# Patient Record
Sex: Female | Born: 1990 | Race: White | Hispanic: No | Marital: Single | Smoking: Current every day smoker
Health system: Southern US, Community
[De-identification: ages and names within clinical notes are randomized; demographics above are authoritative.]

## PROBLEM LIST (undated history)

## (undated) ENCOUNTER — Inpatient Hospital Stay (HOSPITAL_COMMUNITY): Payer: Self-pay

## (undated) DIAGNOSIS — R002 Palpitations: Secondary | ICD-10-CM

## (undated) DIAGNOSIS — Z789 Other specified health status: Secondary | ICD-10-CM

## (undated) DIAGNOSIS — T4145XA Adverse effect of unspecified anesthetic, initial encounter: Secondary | ICD-10-CM

## (undated) DIAGNOSIS — T8859XA Other complications of anesthesia, initial encounter: Secondary | ICD-10-CM

## (undated) DIAGNOSIS — R000 Tachycardia, unspecified: Secondary | ICD-10-CM

## (undated) DIAGNOSIS — I10 Essential (primary) hypertension: Secondary | ICD-10-CM

## (undated) DIAGNOSIS — M259 Joint disorder, unspecified: Secondary | ICD-10-CM

## (undated) DIAGNOSIS — R079 Chest pain, unspecified: Secondary | ICD-10-CM

## (undated) HISTORY — DX: Essential (primary) hypertension: I10

## (undated) HISTORY — DX: Tachycardia, unspecified: R00.0

## (undated) HISTORY — DX: Palpitations: R00.2

## (undated) HISTORY — DX: Chest pain, unspecified: R07.9

## (undated) HISTORY — PX: APPENDECTOMY: SHX54

---

## 2013-10-19 DIAGNOSIS — F339 Major depressive disorder, recurrent, unspecified: Secondary | ICD-10-CM

## 2017-01-21 ENCOUNTER — Inpatient Hospital Stay (HOSPITAL_COMMUNITY)
Admission: AD | Admit: 2017-01-21 | Discharge: 2017-01-22 | Disposition: A | Discharge status: Home / Self Care | Payer: Medicaid Other | Source: Ambulatory Visit | Attending: Obstetrics & Gynecology | Admitting: Obstetrics & Gynecology

## 2017-01-21 DIAGNOSIS — R109 Unspecified abdominal pain: Secondary | ICD-10-CM | POA: Insufficient documentation

## 2017-01-21 DIAGNOSIS — O99331 Smoking (tobacco) complicating pregnancy, first trimester: Secondary | ICD-10-CM | POA: Insufficient documentation

## 2017-01-21 DIAGNOSIS — O219 Vomiting of pregnancy, unspecified: Secondary | ICD-10-CM

## 2017-01-21 DIAGNOSIS — Z3A01 Less than 8 weeks gestation of pregnancy: Secondary | ICD-10-CM | POA: Insufficient documentation

## 2017-01-21 DIAGNOSIS — O26899 Other specified pregnancy related conditions, unspecified trimester: Secondary | ICD-10-CM

## 2017-01-21 DIAGNOSIS — F17210 Nicotine dependence, cigarettes, uncomplicated: Secondary | ICD-10-CM | POA: Insufficient documentation

## 2017-01-21 DIAGNOSIS — O210 Mild hyperemesis gravidarum: Secondary | ICD-10-CM | POA: Insufficient documentation

## 2017-01-21 DIAGNOSIS — O3680X0 Pregnancy with inconclusive fetal viability, not applicable or unspecified: Secondary | ICD-10-CM

## 2017-01-21 DIAGNOSIS — O26891 Other specified pregnancy related conditions, first trimester: Secondary | ICD-10-CM | POA: Insufficient documentation

## 2017-01-21 HISTORY — DX: Adverse effect of unspecified anesthetic, initial encounter: T41.45XA

## 2017-01-21 HISTORY — DX: Other complications of anesthesia, initial encounter: T88.59XA

## 2017-01-21 HISTORY — DX: Other specified health status: Z78.9

## 2017-01-22 ENCOUNTER — Inpatient Hospital Stay (HOSPITAL_COMMUNITY): Payer: Medicaid Other

## 2017-01-22 ENCOUNTER — Encounter: Payer: Self-pay | Admitting: Student

## 2017-01-22 DIAGNOSIS — O210 Mild hyperemesis gravidarum: Secondary | ICD-10-CM | POA: Diagnosis not present

## 2017-01-22 DIAGNOSIS — O219 Vomiting of pregnancy, unspecified: Secondary | ICD-10-CM

## 2017-01-22 DIAGNOSIS — F17210 Nicotine dependence, cigarettes, uncomplicated: Secondary | ICD-10-CM | POA: Diagnosis not present

## 2017-01-22 DIAGNOSIS — O26891 Other specified pregnancy related conditions, first trimester: Secondary | ICD-10-CM | POA: Diagnosis not present

## 2017-01-22 DIAGNOSIS — R109 Unspecified abdominal pain: Secondary | ICD-10-CM | POA: Diagnosis present

## 2017-01-22 DIAGNOSIS — Z3A01 Less than 8 weeks gestation of pregnancy: Secondary | ICD-10-CM | POA: Diagnosis not present

## 2017-01-22 DIAGNOSIS — O99331 Smoking (tobacco) complicating pregnancy, first trimester: Secondary | ICD-10-CM | POA: Diagnosis not present

## 2017-01-22 LAB — CBC
HCT: 44.3 % (ref 36.0–46.0)
Hemoglobin: 15.3 g/dL — ABNORMAL HIGH (ref 12.0–15.0)
MCH: 30.5 pg (ref 26.0–34.0)
MCHC: 34.5 g/dL (ref 30.0–36.0)
MCV: 88.4 fL (ref 78.0–100.0)
Platelets: 292 10*3/uL (ref 150–400)
RBC: 5.01 MIL/uL (ref 3.87–5.11)
RDW: 14.1 % (ref 11.5–15.5)
WBC: 17.3 10*3/uL — ABNORMAL HIGH (ref 4.0–10.5)

## 2017-01-22 LAB — WET PREP, GENITAL
CLUE CELLS WET PREP: NONE SEEN
Sperm: NONE SEEN
Trich, Wet Prep: NONE SEEN
Yeast Wet Prep HPF POC: NONE SEEN

## 2017-01-22 LAB — ABO/RH: ABO/RH(D): A POS

## 2017-01-22 LAB — URINALYSIS, ROUTINE W REFLEX MICROSCOPIC
BILIRUBIN URINE: NEGATIVE
GLUCOSE, UA: NEGATIVE mg/dL
HGB URINE DIPSTICK: NEGATIVE
Ketones, ur: NEGATIVE mg/dL
Nitrite: NEGATIVE
PROTEIN: NEGATIVE mg/dL
RBC / HPF: NONE SEEN RBC/hpf (ref 0–5)
Specific Gravity, Urine: 1.001 — ABNORMAL LOW (ref 1.005–1.030)
pH: 6 (ref 5.0–8.0)

## 2017-01-22 LAB — HCG, QUANTITATIVE, PREGNANCY: HCG, BETA CHAIN, QUANT, S: 3462 m[IU]/mL — AB (ref <5)

## 2017-01-22 LAB — POCT PREGNANCY, URINE: PREG TEST UR: POSITIVE — AB

## 2017-01-22 MED ORDER — PROMETHAZINE HCL 25 MG PO TABS: 25.0000 mg | ORAL_TABLET | Freq: Four times a day (QID) | ORAL | 0 refills | Status: DC | PRN

## 2017-01-22 MED ORDER — PROMETHAZINE HCL 25 MG PO TABS
25.0000 mg | ORAL_TABLET | Freq: Once | ORAL | Status: AC
  Administered 2017-01-22: 25 mg via ORAL
  Filled 2017-01-22: qty 1

## 2017-01-22 NOTE — Discharge Instructions (Signed)
Return to care  °· If you have heavier bleeding that soaks through more that 2 pads per hour for an hour or more °· If you bleed so much that you feel like you might pass out or you do pass out °· If you have significant abdominal pain that is not improved with Tylenol  °· If you develop a fever > 100.5 ° °

## 2017-01-22 NOTE — MAU Provider Note (Signed)
History     CSN: [id number]  Arrival date and time: 01/21/17 2353  First Provider Initiated Contact with Patient 01/22/17 0039      Chief Complaint  Patient presents with  . Abdominal Pain  . Nausea  . Emesis   HPI Yvonne Hart is a 26 y.o. G2P1011 at [redacted]w[redacted]d by unsure LMP who presents with abdominal pain & n/v. Reports intermittent lower abdominal pain x 3 days. When pain occurs rates 7/10. Has not treated. Nothing makes pain better or worse. Endorses nausea & vomiting. Vomited 3-4 times per day. Currently nauseated. Denies vaginal discharge, vaginal bleeding, dysuria, diarrhea, or constipation.   OB History    Gravida Para Term Preterm AB Living   2 1 1   1 1    SAB TAB Ectopic Multiple Live Births   1     0 1      Past Medical History:  Diagnosis Date  . Complication of anesthesia    alergic to the ansthetic when she had her Appendectomy  . Medical history non-contributory     Past Surgical History:  Procedure Laterality Date  . APPENDECTOMY    . CESAREAN SECTION      No family history on file.  Social History  Substance Use Topics  . Smoking status: Current Every Day Smoker    Packs/day: 0.50    Types: Cigarettes  . Smokeless tobacco: Never Used  . Alcohol use No    Allergies: Allergies not on file  No prescriptions prior to admission.    Review of Systems  Constitutional: Negative.   Gastrointestinal: Positive for abdominal pain, nausea and vomiting. Negative for constipation and diarrhea.  Genitourinary: Negative.    Physical Exam   Blood pressure 139/72, pulse (!) 105, temperature 98.1 F (36.7 C), temperature source Oral, resp. rate 18, height 5\' 7"  (1.702 m), weight 178 lb (80.7 kg), last menstrual period 12/18/2016, unknown if currently breastfeeding.  Physical Exam  Nursing note and vitals reviewed. Constitutional: She is oriented to person, place, and time. She appears well-developed and well-nourished. No distress.  HENT:  Head:  Normocephalic and atraumatic.  Eyes: Conjunctivae are normal. Right eye exhibits no discharge. Left eye exhibits no discharge. No scleral icterus.  Neck: Normal range of motion.  Cardiovascular: Normal rate, regular rhythm and normal heart sounds.   No murmur heard. Respiratory: Effort normal and breath sounds normal. No respiratory distress. She has no wheezes.  GI: Soft. Bowel sounds are normal. She exhibits no distension. There is no tenderness. There is no rebound and no guarding.  Neurological: She is alert and oriented to person, place, and time.  Skin: Skin is warm and dry. She is not diaphoretic.  Psychiatric: She has a normal mood and affect. Her behavior is normal. Judgment and thought content normal.    MAU Course  Procedures Results for orders placed or performed during the hospital encounter of 01/21/17 (from the past 24 hour(s))  Urinalysis, Routine w reflex microscopic (not at [facility])     Status: Abnormal   Collection Time: 01/22/17 12:05 AM  Result Value Ref Range   Color, Urine YELLOW YELLOW   APPearance HAZY (A) CLEAR   Specific Gravity, Urine 1.001 (L) 1.005 - 1.030   pH 6.0 5.0 - 8.0   Glucose, UA NEGATIVE NEGATIVE mg/dL   Hgb urine dipstick NEGATIVE NEGATIVE   Bilirubin Urine NEGATIVE NEGATIVE   Ketones, ur NEGATIVE NEGATIVE mg/dL   Protein, ur NEGATIVE NEGATIVE mg/dL   Nitrite NEGATIVE NEGATIVE  Leukocytes, UA MODERATE (A) NEGATIVE   RBC / HPF NONE SEEN 0 - 5 RBC/hpf   WBC, UA 0-5 0 - 5 WBC/hpf   Bacteria, UA RARE (A) NONE SEEN   Squamous Epithelial / LPF 0-5 (A) NONE SEEN  Pregnancy, urine POC     Status: Abnormal   Collection Time: 01/22/17 12:09 AM  Result Value Ref Range   Preg Test, Ur POSITIVE (A) NEGATIVE  Wet prep, genital     Status: Abnormal   Collection Time: 01/22/17 12:35 AM  Result Value Ref Range   Yeast Wet Prep HPF POC NONE SEEN NONE SEEN   Trich, Wet Prep NONE SEEN NONE SEEN   Clue Cells Wet Prep HPF POC NONE SEEN NONE SEEN   WBC, Wet  Prep HPF POC MODERATE (A) NONE SEEN   Sperm NONE SEEN   CBC     Status: Abnormal   Collection Time: 01/22/17 12:48 AM  Result Value Ref Range   WBC 17.3 (H) 4.0 - 10.5 K/uL   RBC 5.01 3.87 - 5.11 MIL/uL   Hemoglobin 15.3 (H) 12.0 - 15.0 g/dL   HCT 16.1 09.6 - 04.5 %   MCV 88.4 78.0 - 100.0 fL   MCH 30.5 26.0 - 34.0 pg   MCHC 34.5 30.0 - 36.0 g/dL   RDW 40.9 81.1 - 91.4 %   Platelets 292 150 - 400 K/uL  ABO/Rh     Status: None (Preliminary result)   Collection Time: 01/22/17 12:48 AM  Result Value Ref Range   ABO/RH(D) A POS   hCG, quantitative, pregnancy     Status: Abnormal   Collection Time: 01/22/17 12:48 AM  Result Value Ref Range   hCG, Beta Chain, Quant, S 3,462 (H) <5 mIU/mL   US Ob Transvaginal  Result Date: 01/22/2017 CLINICAL DATA:  Abdominal pain EXAM: OBSTETRIC <14 WK [place] AND TRANSVAGINAL OB US TECHNIQUE: Both transabdominal and transvaginal ultrasound examinations were performed for complete evaluation of the gestation as well as the maternal uterus, adnexal regions, and pelvic cul-de-sac. Transvaginal technique was performed to assess early pregnancy. COMPARISON:  None. FINDINGS: Intrauterine gestational sac: Probable intrauterine gestational sac Yolk sac:  Not seen Embryo:  Not seen MSD: 4.9  mm   5 w   1  d Subchorionic hemorrhage:  None visualized. Maternal uterus/adnexae: Small fluid in the upper and lower endometrium. Probable corpus luteal cyst in the right ovary measuring 2.2 cm. Right ovary measures 4.1 x 2.4 by 3.1 cm. Left ovary measures 1.9 by 2.2 x 1.7 cm. No significant free fluid. IMPRESSION: 1. Probable early intrauterine gestational sac, but no yolk sac, fetal pole, or cardiac activity yet visualized. Recommend follow-up quantitative B-HCG levels and follow-up US in 14 days to assess viability. This recommendation follows SRU consensus guidelines: Diagnostic Criteria for Nonviable Pregnancy Early in the First Trimester. Malva Limes Med 2013; 782:9562-13. 2. Small  amount of fluid within the upper and lower endometrial canal Electronically Signed   By: Jasmine Pang M.D.   On: 01/22/2017 01:42    MDM +UPT UA, wet prep, GC/chlamydia, CBC, ABO/Rh, quant hCG, HIV, and [place] today to rule out ectopic pregnancy A positive Phenergan 25 mg PO Ultrasound shows possible IUGS, no yolk sac This abdominal pain could represent a normal pregnancy, spontaneous abortion, or even an ectopic pregnancy which can be life-threatening. Cultures were obtained to rule out pelvic infection.  Assessment and Plan  A: 1. Pregnancy of unknown anatomic location   2. Abdominal pain during pregnancy  3. Nausea and vomiting during pregnancy prior to [redacted] weeks gestation    P: Discharge home Rx phenergan Discussed reasons to return to MAU including s/s ectopic pregnancy Patient to f/u in [facility] Monday morning for repeat bhcg GC/CT pending   Judeth Hornrin Kartik Fernando 01/22/2017, 12:39 AM

## 2017-01-22 NOTE — MAU Note (Signed)
Pt reports she has had a positive HPT(x6). Started having n/v x3 days and sharp lower abd x 2 days Pain is more on her left side.. Denies any vaginal discharge or bleeding. Unsure of her LMP. Had some spotting around 12/18/2016 but it was never like a regular period.

## 2017-01-24 ENCOUNTER — Ambulatory Visit: Payer: Self-pay | Admitting: General Practice

## 2017-01-24 ENCOUNTER — Telehealth: Payer: Self-pay | Admitting: General Practice

## 2017-01-24 DIAGNOSIS — O3680X0 Pregnancy with inconclusive fetal viability, not applicable or unspecified: Secondary | ICD-10-CM

## 2017-01-24 LAB — HCG, QUANTITATIVE, PREGNANCY: hCG, Beta Chain, Quant, S: 8608 m[IU]/mL — ABNORMAL HIGH (ref <5)

## 2017-01-24 LAB — GC/CHLAMYDIA PROBE AMP (~~LOC~~) NOT AT ARMC
Chlamydia: NEGATIVE
NEISSERIA GONORRHEA: NEGATIVE

## 2017-01-24 NOTE — Telephone Encounter (Signed)
Called patient and informed her of ultrasound appt on 7/24 with brief office visit to follow for results. Patient verbalized understanding & had no questions

## 2017-01-24 NOTE — Progress Notes (Signed)
Patient here today for a stat bhcg. Patient reports continued abdominal cramping & nausea. Instructed patient to wait in lobby for results & updated plan of care. Patient verbalizes understanding. Per Dr Alysia PennaErvin, patient has had sufficient increase in bhcg levels. Needs ultrasound in a week. Informed patient of results & recommended ultrasound. Told patient I will call her this afternoon with an ultrasound appt. Patient verbalized understanding & states a detailed message can be left on voicemail.

## 2017-02-01 ENCOUNTER — Ambulatory Visit (HOSPITAL_COMMUNITY)
Admission: RE | Admit: 2017-02-01 | Discharge: 2017-02-01 | Disposition: A | Discharge status: Home / Self Care | Payer: Medicaid Other | Source: Ambulatory Visit | Attending: Obstetrics and Gynecology | Admitting: Obstetrics and Gynecology

## 2017-02-01 ENCOUNTER — Encounter: Payer: Self-pay | Admitting: Family Medicine

## 2017-02-01 ENCOUNTER — Ambulatory Visit (INDEPENDENT_AMBULATORY_CARE_PROVIDER_SITE_OTHER): Payer: Self-pay

## 2017-02-01 DIAGNOSIS — Z369 Encounter for antenatal screening, unspecified: Secondary | ICD-10-CM

## 2017-02-01 DIAGNOSIS — Z3491 Encounter for supervision of normal pregnancy, unspecified, first trimester: Secondary | ICD-10-CM | POA: Diagnosis not present

## 2017-02-01 DIAGNOSIS — O3680X0 Pregnancy with inconclusive fetal viability, not applicable or unspecified: Secondary | ICD-10-CM

## 2017-02-01 DIAGNOSIS — Z3A01 Less than 8 weeks gestation of pregnancy: Secondary | ICD-10-CM | POA: Diagnosis not present

## 2017-02-01 DIAGNOSIS — N898 Other specified noninflammatory disorders of vagina: Secondary | ICD-10-CM

## 2017-02-01 DIAGNOSIS — Z3490 Encounter for supervision of normal pregnancy, unspecified, unspecified trimester: Secondary | ICD-10-CM | POA: Diagnosis present

## 2017-02-01 DIAGNOSIS — Z113 Encounter for screening for infections with a predominantly sexual mode of transmission: Secondary | ICD-10-CM

## 2017-02-01 NOTE — Progress Notes (Signed)
Patient came in for US results. I explained to patient that her US shows that she is 6 w 1d pregnant with an EDD of 09/26/17. I advised patient to start taking prenatal vitamins and to start her prenatal care.Patient also complained of having green itchy discharge. I had the patient self swab and verified her pharmacy. I explained to patient that I will call her with results as soon as they come in. Patient had no questions and verbalized understanding.

## 2017-02-01 NOTE — Addendum Note (Signed)
Addended by: Mikey BussingWILSON, Mirtha Jain L on: 02/01/2017 02:13 PM   Modules accepted: Orders

## 2017-02-02 LAB — CERVICOVAGINAL ANCILLARY ONLY
Bacterial vaginitis: NEGATIVE
Candida vaginitis: NEGATIVE
Chlamydia: NEGATIVE
NEISSERIA GONORRHEA: NEGATIVE
TRICH (WINDOWPATH): POSITIVE — AB

## 2017-02-03 ENCOUNTER — Other Ambulatory Visit: Payer: Self-pay | Admitting: Family Medicine

## 2017-02-03 ENCOUNTER — Telehealth: Payer: Self-pay

## 2017-02-03 DIAGNOSIS — A599 Trichomoniasis, unspecified: Secondary | ICD-10-CM

## 2017-02-03 MED ORDER — METRONIDAZOLE 500 MG PO TABS: 500.0000 mg | ORAL_TABLET | Freq: Two times a day (BID) | ORAL | 0 refills | Status: DC

## 2017-02-03 NOTE — Telephone Encounter (Signed)
Opened in error

## 2017-02-03 NOTE — Telephone Encounter (Signed)
Called patient to inform her that her wet prep came back positive for Trichomonas. Advised patient to have her partner treated as well. Medication was called in to pharmacy. Patient verbalized understanding.

## 2017-05-23 DIAGNOSIS — O34219 Maternal care for unspecified type scar from previous cesarean delivery: Secondary | ICD-10-CM | POA: Insufficient documentation

## 2017-12-17 ENCOUNTER — Encounter (HOSPITAL_COMMUNITY): Payer: Self-pay

## 2018-02-10 ENCOUNTER — Other Ambulatory Visit: Payer: Self-pay

## 2018-02-10 ENCOUNTER — Encounter (HOSPITAL_COMMUNITY): Payer: Self-pay

## 2018-02-10 ENCOUNTER — Encounter (HOSPITAL_COMMUNITY)
Admission: RE | Admit: 2018-02-10 | Discharge: 2018-02-10 | Disposition: A | Discharge status: Home / Self Care | Payer: Medicaid Other | Source: Ambulatory Visit | Attending: Orthopedic Surgery | Admitting: Orthopedic Surgery

## 2018-02-10 DIAGNOSIS — Z01812 Encounter for preprocedural laboratory examination: Secondary | ICD-10-CM | POA: Insufficient documentation

## 2018-02-10 HISTORY — DX: Joint disorder, unspecified: M25.9

## 2018-02-10 LAB — CBC
HCT: 46.7 % — ABNORMAL HIGH (ref 36.0–46.0)
Hemoglobin: 14.9 g/dL (ref 12.0–15.0)
MCH: 29.2 pg (ref 26.0–34.0)
MCHC: 31.9 g/dL (ref 30.0–36.0)
MCV: 91.6 fL (ref 78.0–100.0)
Platelets: 365 10*3/uL (ref 150–400)
RBC: 5.1 MIL/uL (ref 3.87–5.11)
RDW: 14.1 % (ref 11.5–15.5)
WBC: 10 10*3/uL (ref 4.0–10.5)

## 2018-02-10 LAB — BASIC METABOLIC PANEL
ANION GAP: 6 (ref 5–15)
BUN: 7 mg/dL (ref 6–20)
CALCIUM: 9.2 mg/dL (ref 8.9–10.3)
CO2: 25 mmol/L (ref 22–32)
CREATININE: 0.94 mg/dL (ref 0.44–1.00)
Chloride: 109 mmol/L (ref 98–111)
GFR calc non Af Amer: >60 mL/min (ref >60)
Glucose, Bld: 85 mg/dL (ref 70–99)
Potassium: 3.9 mmol/L (ref 3.5–5.1)
Sodium: 140 mmol/L (ref 135–145)

## 2018-02-10 LAB — SURGICAL PCR SCREEN
MRSA, PCR: NEGATIVE
Staphylococcus aureus: NEGATIVE

## 2018-02-10 NOTE — Progress Notes (Signed)
PCP - [facility]  Cardiologist - Denies  Chest x-ray - Denies  EKG - Denies  Stress Test - Denies  ECHO - Denies  Cardiac Cath - Denies  Sleep Study - Denies CPAP - None  LABS- 02/10/18: CBC, BMP 02/15/18: POC Upreg  ASA- Denies  Pt advised to check with surgeon office regarding her recent Bilateral nipple piercing, as she is not able to remove for surgery.  Anesthesia- No  Pt denies having chest pain, sob, or fever at this time. All instructions explained to the pt, with a verbal understanding of the material. Pt agrees to go over the instructions while at home for a better understanding. The opportunity to ask questions was provided.

## 2018-02-10 NOTE — Pre-Procedure Instructions (Signed)
Yvonne Hart  02/10/2018      [facility] [place] - [id number] [place] [id number] Phone: [phone] Fax: [phone]    Your procedure is scheduled on Wed., Aug. 7, 2019 from 3:30PM-5:21PM  [facility] "A" at 1:30PM  Call this number if you have problems the morning of surgery:  [phone]   Remember:  Do not eat or drink after midnight on Aug. 6th    Take these medicines the morning of surgery with A SIP OF WATER: NONE  As of today, stop taking all Other Aspirin Products, Vitamins, Fish oils, and Herbal medications. Also stop all NSAIDS i.e. Advil, Ibuprofen, Motrin, Aleve, Anaprox, Naproxen, BC, Goody Powders, and all Supplements.    Do not wear jewelry, make-up or nail polish.  Do not wear lotions, powders, or perfumes, or deodorant.  Do not shave 48 hours prior to surgery.    Do not bring valuables to the hospital.  [facility] is not responsible for any belongings or valuables.  Contacts, dentures or bridgework may not be worn into surgery.  Leave your suitcase in the car.  After surgery it may be brought to your room.  For patients admitted to the hospital, discharge time will be determined by your treatment team.  Patients discharged the day of surgery will not be allowed to drive home.   Special instructions:   [place]- Preparing For Surgery  Before surgery, you can play an important role. Because skin is not sterile, your skin needs to be as free of germs as possible. You can reduce the number of germs on your skin by washing with CHG (chlorahexidine gluconate) Soap before surgery.  CHG is an antiseptic cleaner which kills germs and bonds with the skin to continue killing germs even after washing.    Oral Hygiene is also important to reduce your risk of infection.  Remember - BRUSH YOUR TEETH THE MORNING OF SURGERY WITH YOUR REGULAR TOOTHPASTE  Please do not use  if you have an allergy to CHG or antibacterial soaps. If your skin becomes reddened/irritated stop using the CHG.  Do not shave (including legs and underarms) for at least 48 hours prior to first CHG shower. It is OK to shave your face.  Please follow these instructions carefully.   1. Shower the NIGHT BEFORE SURGERY and the MORNING OF SURGERY with CHG.   2. If you chose to wash your hair, wash your hair first as usual with your normal shampoo.  3. After you shampoo, rinse your hair and body thoroughly to remove the shampoo.  4. Use CHG as you would any other liquid soap. You can apply CHG directly to the skin and wash gently with a scrungie or a clean washcloth.   5. Apply the CHG Soap to your body ONLY FROM THE NECK DOWN.  Do not use on open wounds or open sores. Avoid contact with your eyes, ears, mouth and genitals (private parts). Wash Face and genitals (private parts)  with your normal soap.  6. Wash thoroughly, paying special attention to the area where your surgery will be performed.  7. Thoroughly rinse your body with warm water from the neck down.  8. DO NOT shower/wash with your normal soap after using and rinsing off the CHG Soap.  9. Pat yourself dry with a CLEAN TOWEL.  10. Wear CLEAN PAJAMAS to bed the night before surgery, wear comfortable  clothes the morning of surgery  11. Place CLEAN SHEETS on your bed the night of your first shower and DO NOT SLEEP WITH PETS.  Day of Surgery:  Do not apply any deodorants/lotions.  Please wear clean clothes to the hospital/surgery center.   Remember to brush your teeth WITH YOUR REGULAR TOOTHPASTE.  Please read over the following fact sheets that you were given. Pain Booklet, Coughing and Deep Breathing, MRSA Information and Surgical Site Infection Prevention

## 2018-02-14 ENCOUNTER — Encounter (HOSPITAL_COMMUNITY): Payer: Self-pay | Admitting: Anesthesiology

## 2018-02-14 NOTE — Anesthesia Preprocedure Evaluation (Deleted)
Anesthesia Evaluation  Patient identified by MRN, date of birth, ID band Patient awake    Reviewed: Allergy & Precautions, NPO status , Patient's Chart, lab work & pertinent test results  Airway Mallampati: II  TM Distance: >3 FB Neck ROM: Full    Dental no notable dental hx. (+) Teeth Intact, Dental Advisory Given   Pulmonary Current Smoker,    Pulmonary exam normal breath sounds clear to auscultation       Cardiovascular Exercise Tolerance: Good negative cardio ROS Normal cardiovascular exam Rhythm:Regular Rate:Normal     Neuro/Psych negative neurological ROS  negative psych ROS   GI/Hepatic negative GI ROS, Neg liver ROS,   Endo/Other  negative endocrine ROS  Renal/GU      Musculoskeletal   Abdominal   Peds  Hematology negative hematology ROS (+)   Anesthesia Other Findings   Reproductive/Obstetrics negative OB ROS                             Anesthesia Physical Anesthesia Plan  ASA: II  Anesthesia Plan: Regional and General   Post-op Pain Management:    Induction: Intravenous  PONV Risk Score and Plan: 2 and Treatment may vary due to age or medical condition, Dexamethasone and Ondansetron  Airway Management Planned: LMA  Additional Equipment:   Intra-op Plan:   Post-operative Plan:   Informed Consent: I have reviewed the patients History and Physical, chart, labs and discussed the procedure including the risks, benefits and alternatives for the proposed anesthesia with the patient or authorized representative who has indicated his/her understanding and acceptance.   Dental advisory given  Plan Discussed with:   Anesthesia Plan Comments:         Anesthesia Quick Evaluation

## 2018-02-15 ENCOUNTER — Encounter (HOSPITAL_COMMUNITY): Admission: RE | Payer: Self-pay | Source: Ambulatory Visit

## 2018-02-15 ENCOUNTER — Ambulatory Visit (HOSPITAL_COMMUNITY): Admission: RE | Admit: 2018-02-15 | Payer: Medicaid Other | Source: Ambulatory Visit | Admitting: Orthopedic Surgery

## 2018-02-15 SURGERY — CARPECTOMY
Anesthesia: General | Laterality: Right

## 2018-04-29 IMAGING — US US OB COMP LESS 14 WK
1 series · 15 of 28 positions shown · non-contrast
Comparison: None.

CLINICAL DATA: Abdominal pain

EXAM:
OBSTETRIC <14 WK US AND TRANSVAGINAL OB US
TECHNIQUE: Both transabdominal and transvaginal ultrasound examinations were
performed for complete evaluation of the gestation as well as the
maternal uterus, adnexal regions, and pelvic cul-de-sac.
Transvaginal technique was performed to assess early pregnancy.

[Series 1: us ob comp less 14 wk · 15 of 61 slices shown]
[im 1/61]
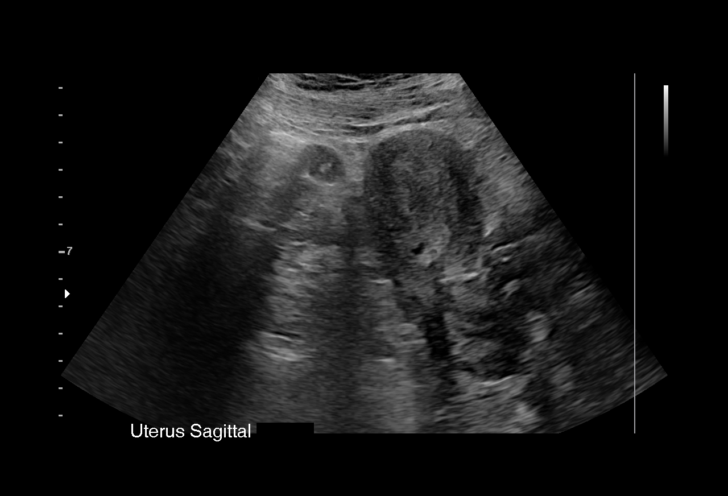
[im 5/61]
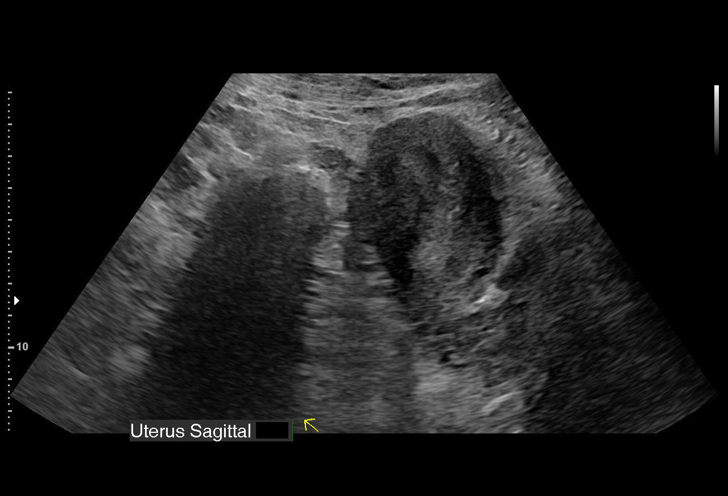
[im 9/61]
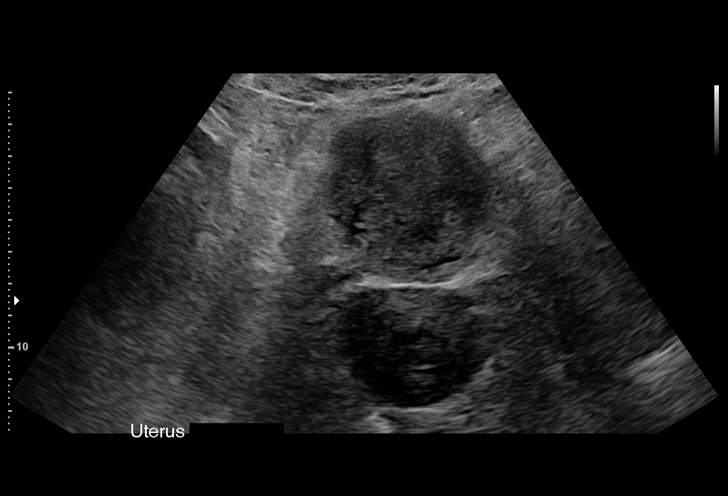
[im 14/61]
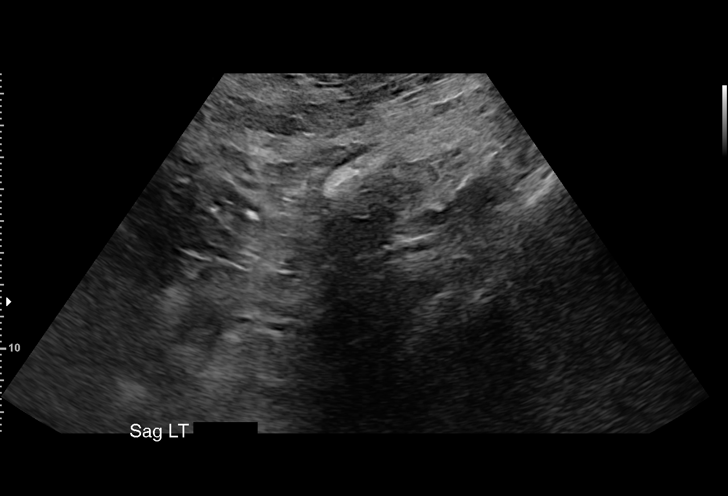
[im 18/61]
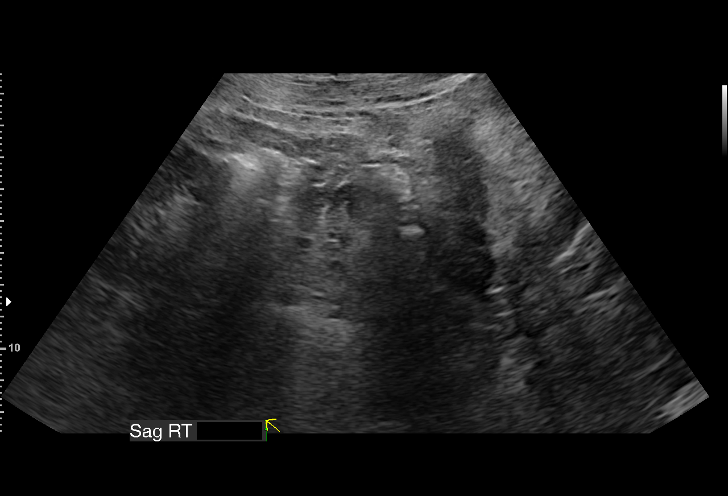
[im 23/61]
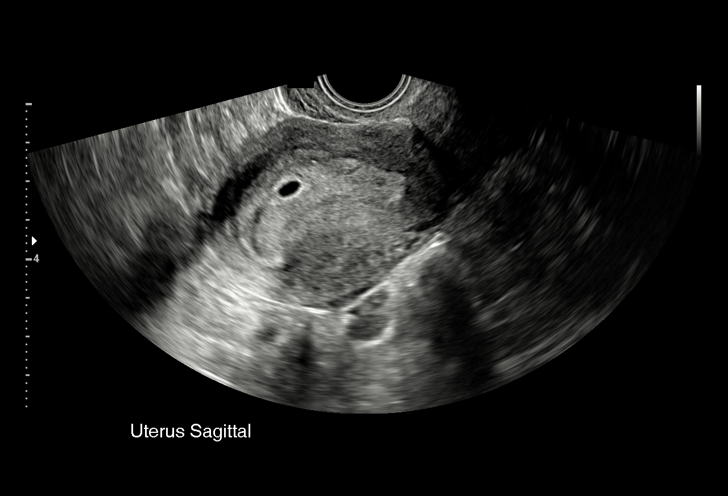
[im 27/61]
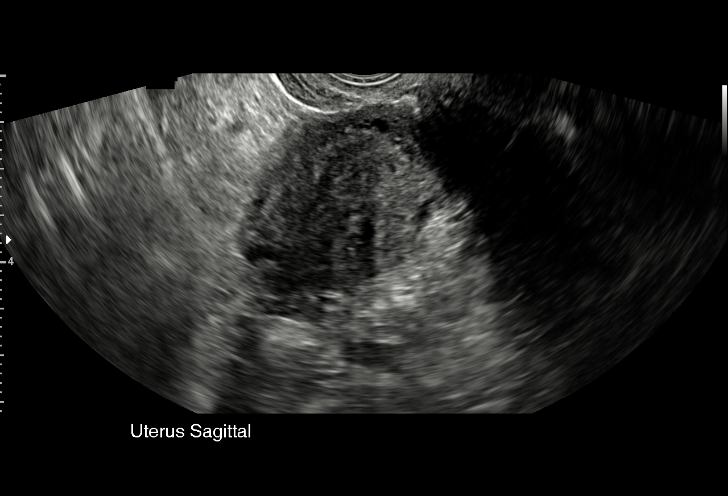
[im 32/61]
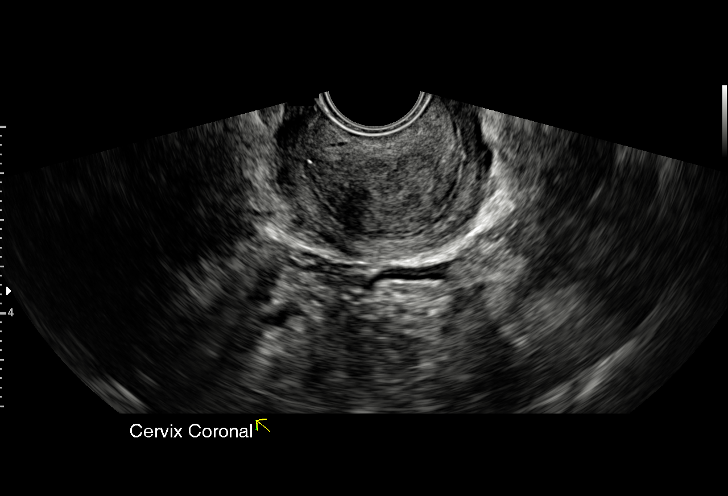
[im 34/61]
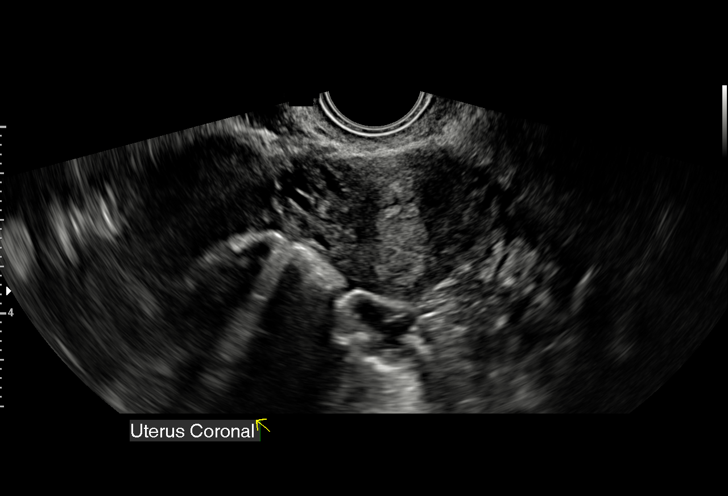
[im 38/61]
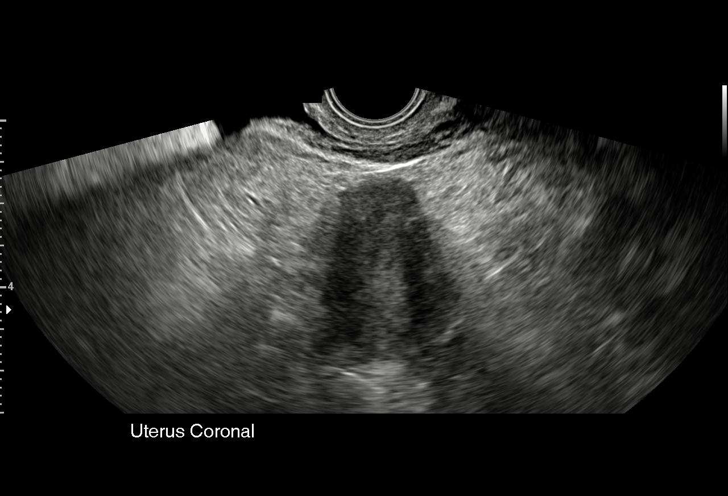
[im 43/61]
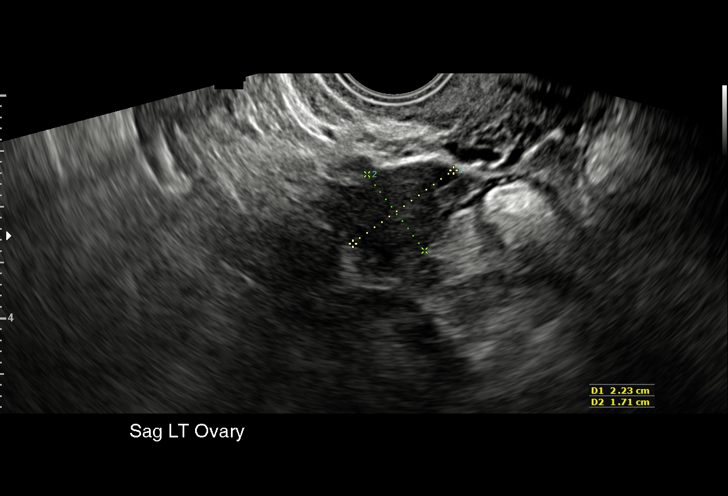
[im 47/61]
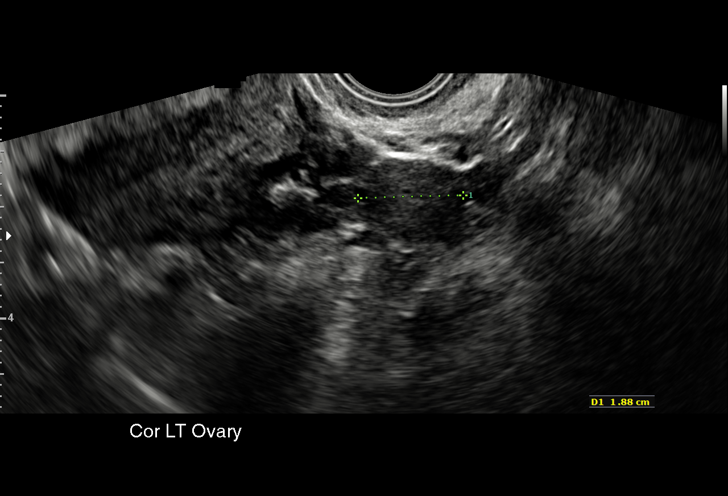
[im 52/61]
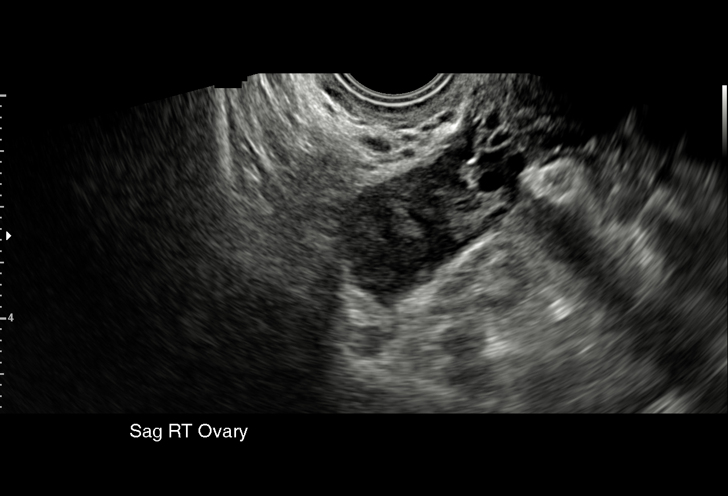
[im 56/61]
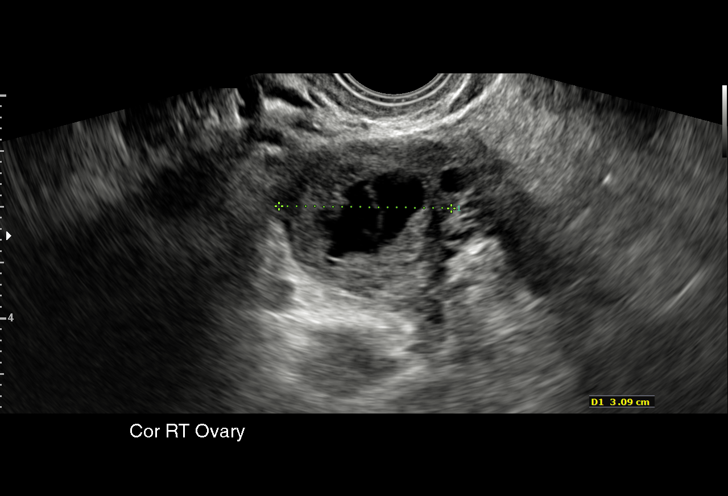
[im 61/61]
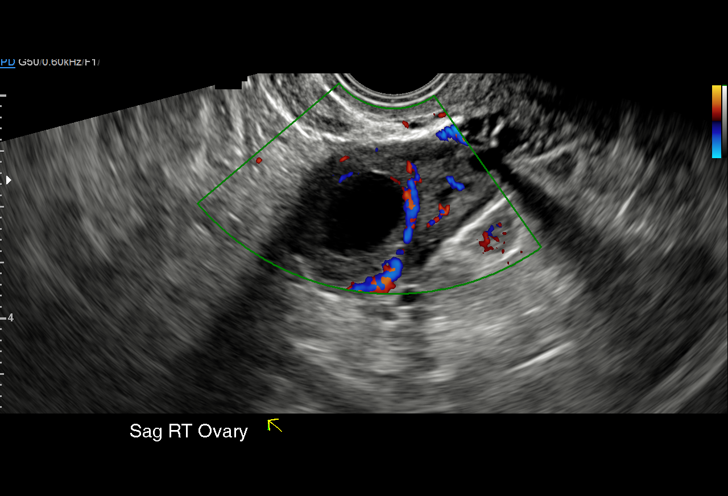

[15 of 28 positions shown; findings below may reference images not displayed]

FINDINGS: Intrauterine gestational sac: Probable intrauterine gestational sac

Yolk sac:  Not seen

Embryo:  Not seen

MSD: 4.9  mm   5 w   1  d

Subchorionic hemorrhage:  None visualized.

Maternal uterus/adnexae: Small fluid in the upper and lower
endometrium. Probable corpus luteal cyst in the right ovary
measuring 2.2 cm. Right ovary measures 4.1 x 2.4 by 3.1 cm. Left
ovary measures 1.9 by 2.2 x 1.7 cm. No significant free fluid.
IMPRESSION: 1. Probable early intrauterine gestational sac, but no yolk sac,
fetal pole, or cardiac activity yet visualized. Recommend follow-up
quantitative B-HCG levels and follow-up US in 14 days to assess
viability. This recommendation follows SRU consensus guidelines:
Diagnostic Criteria for Nonviable Pregnancy Early in the First
Trimester. N Engl J Med 5022; [DATE].
2. Small amount of fluid within the upper and lower endometrial
[place]

## 2018-05-09 IMAGING — US US OB TRANSVAGINAL
1 series · 15 of 28 positions shown · non-contrast
Comparison: None.

CLINICAL DATA: Viability

EXAM:
TRANSVAGINAL OB ULTRASOUND
TECHNIQUE: Transvaginal ultrasound was performed for complete evaluation of the
gestation as well as the maternal uterus, adnexal regions, and
pelvic cul-de-sac.

[Series 1: us ob transvaginal · 15 of 44 slices shown]
[im 1/44]
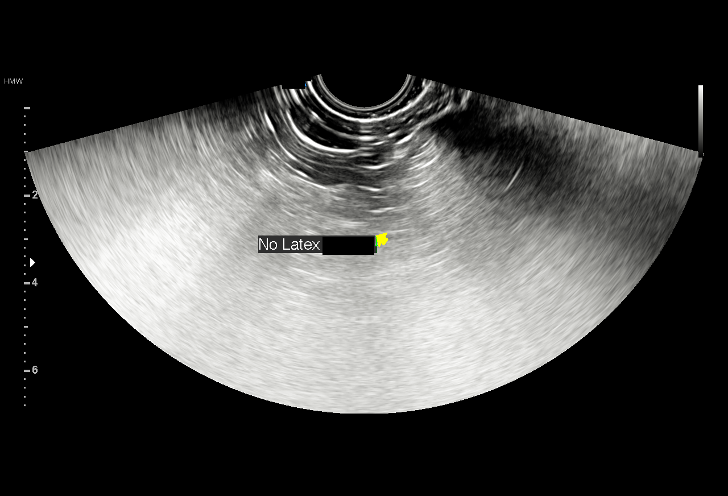
[im 4/44]
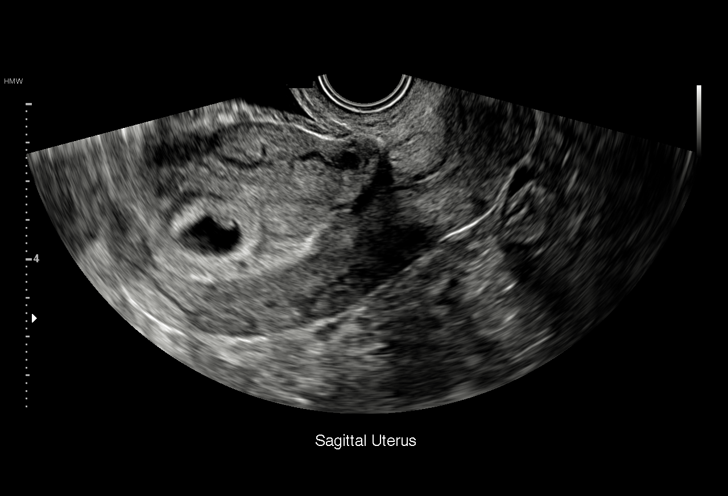
[im 7/44]
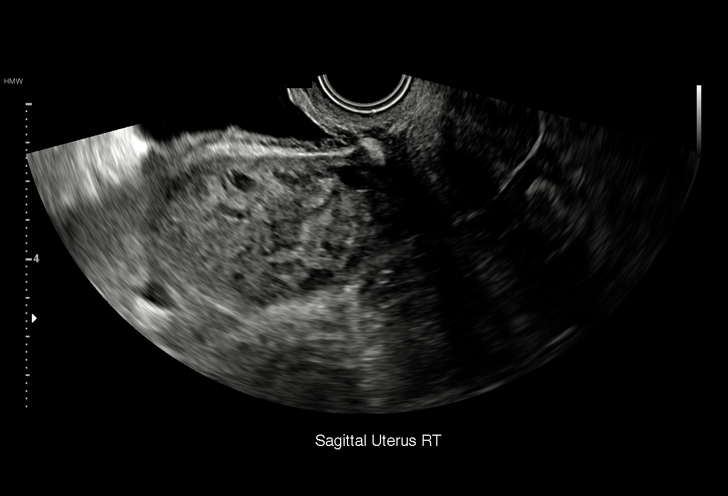
[im 10/44]
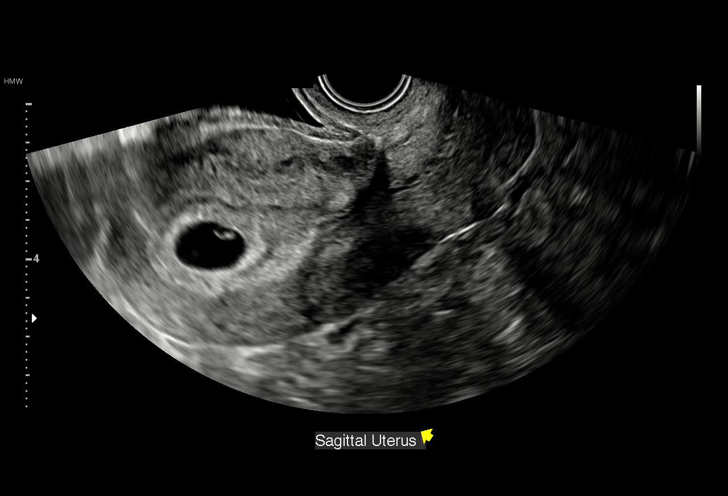
[im 13/44]
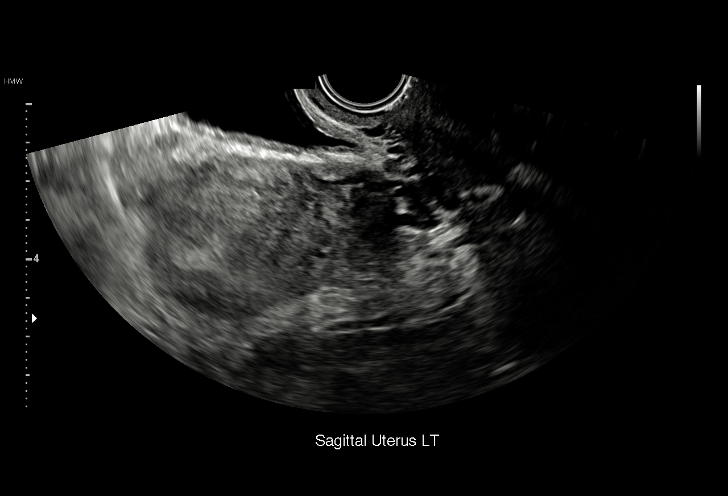
[im 16/44]
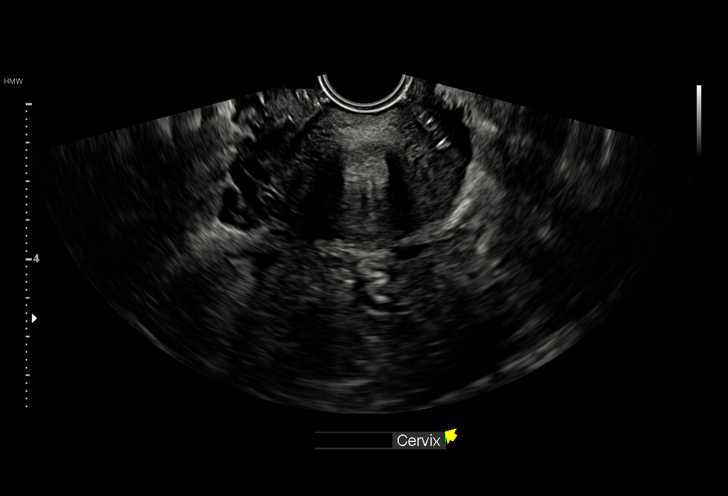
[im 20/44]
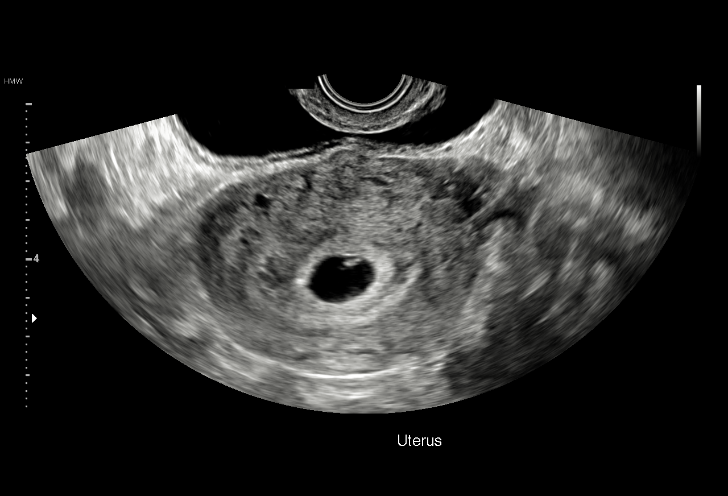
[im 23/44]
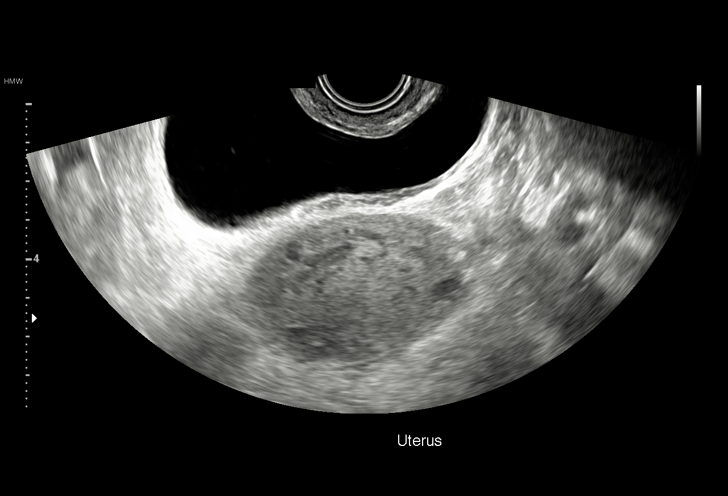
[im 24/44]
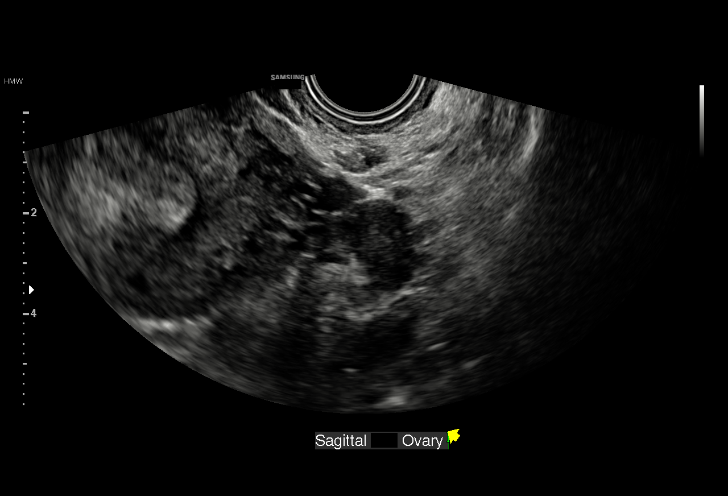
[im 28/44]
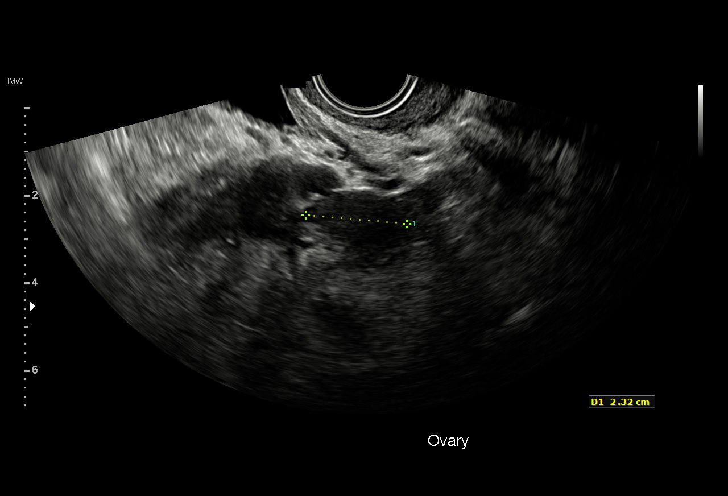
[im 31/44]
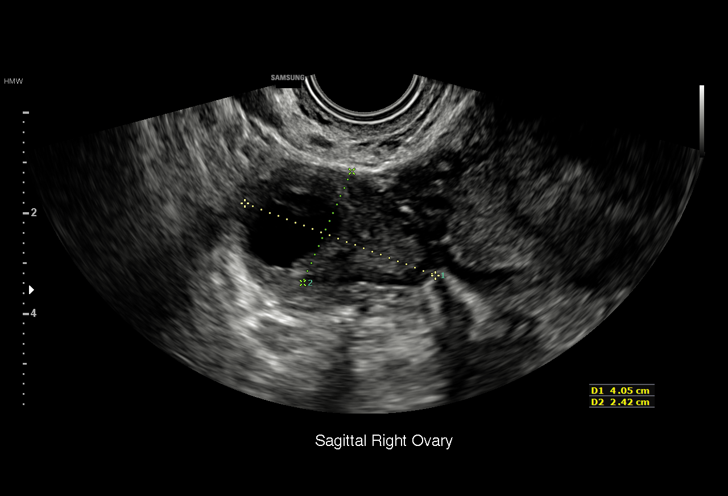
[im 34/44]
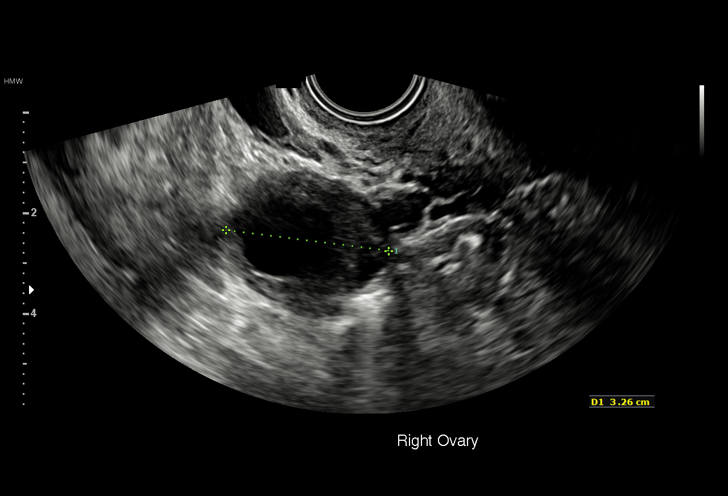
[im 37/44]
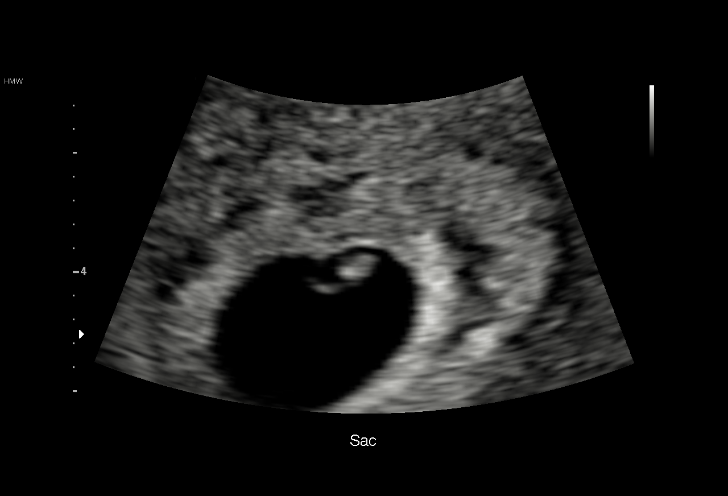
[im 40/44]
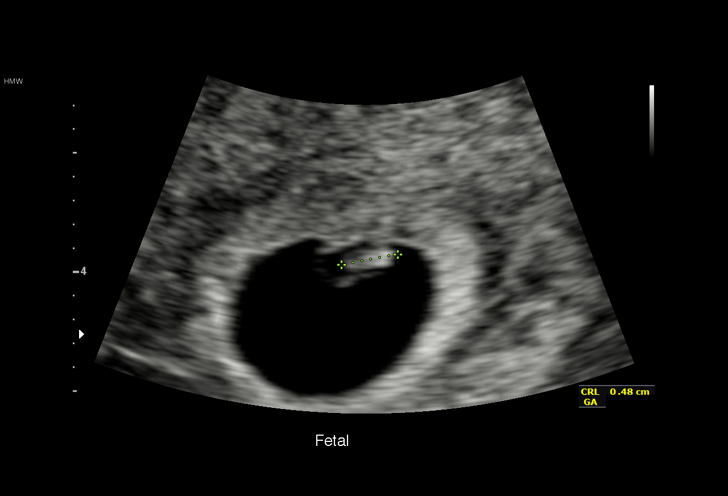
[im 44/44]
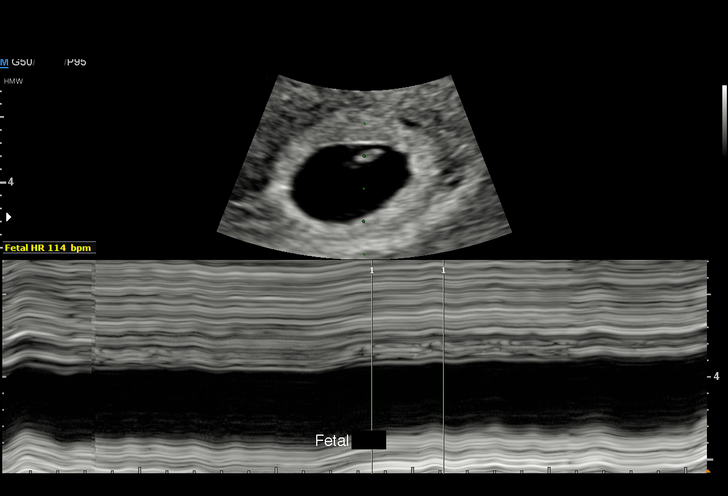

[15 of 28 positions shown; findings below may reference images not displayed]

FINDINGS: Intrauterine gestational sac: Single

Yolk sac:  Visualized

Embryo:  Visualized

Cardiac Activity: Visualized

Heart Rate: 114 bpm

MSD:   mm    w     d

CRL:   4.9  mm   6 w 1 d                  US EDC: 09/26/2017

Subchorionic hemorrhage:  None visualized.

Maternal uterus/adnexae: No adnexal mass or free fluid.
IMPRESSION: Six week 1 day intrauterine pregnancy. Fetal heart rate 114 beats
per minute. No acute maternal findings.

## 2023-02-09 ENCOUNTER — Other Ambulatory Visit: Payer: Self-pay

## 2023-02-09 DIAGNOSIS — R079 Chest pain, unspecified: Secondary | ICD-10-CM | POA: Insufficient documentation

## 2023-02-09 DIAGNOSIS — T8859XA Other complications of anesthesia, initial encounter: Secondary | ICD-10-CM | POA: Insufficient documentation

## 2023-02-09 DIAGNOSIS — R000 Tachycardia, unspecified: Secondary | ICD-10-CM | POA: Insufficient documentation

## 2023-02-09 DIAGNOSIS — M259 Joint disorder, unspecified: Secondary | ICD-10-CM | POA: Insufficient documentation

## 2023-02-09 DIAGNOSIS — R002 Palpitations: Secondary | ICD-10-CM | POA: Insufficient documentation

## 2023-02-09 DIAGNOSIS — I10 Essential (primary) hypertension: Secondary | ICD-10-CM | POA: Insufficient documentation

## 2023-02-09 DIAGNOSIS — Z789 Other specified health status: Secondary | ICD-10-CM | POA: Insufficient documentation

## 2023-03-02 ENCOUNTER — Ambulatory Visit: Payer: Medicaid Other | Admitting: Cardiology

## 2023-03-16 ENCOUNTER — Ambulatory Visit: Payer: Medicaid Other | Attending: Cardiology

## 2023-05-02 ENCOUNTER — Other Ambulatory Visit: Payer: Self-pay

## 2023-05-02 ENCOUNTER — Other Ambulatory Visit (HOSPITAL_COMMUNITY): Payer: Self-pay

## 2023-05-02 ENCOUNTER — Other Ambulatory Visit (HOSPITAL_BASED_OUTPATIENT_CLINIC_OR_DEPARTMENT_OTHER): Payer: Self-pay

## 2023-05-02 MED ORDER — WEGOVY 0.5 MG/0.5ML ~~LOC~~ SOAJ
0.5000 mg | SUBCUTANEOUS | 0 refills | Status: DC
  Filled 2023-05-02 (×2): qty 2, 28d supply, fill #0

## 2023-05-03 ENCOUNTER — Other Ambulatory Visit (HOSPITAL_BASED_OUTPATIENT_CLINIC_OR_DEPARTMENT_OTHER): Payer: Self-pay

## 2023-05-04 ENCOUNTER — Other Ambulatory Visit (HOSPITAL_BASED_OUTPATIENT_CLINIC_OR_DEPARTMENT_OTHER): Payer: Self-pay

## 2023-05-05 ENCOUNTER — Other Ambulatory Visit (HOSPITAL_BASED_OUTPATIENT_CLINIC_OR_DEPARTMENT_OTHER): Payer: Self-pay

## 2023-06-06 ENCOUNTER — Other Ambulatory Visit (HOSPITAL_BASED_OUTPATIENT_CLINIC_OR_DEPARTMENT_OTHER): Payer: Self-pay

## 2023-06-07 ENCOUNTER — Other Ambulatory Visit (HOSPITAL_BASED_OUTPATIENT_CLINIC_OR_DEPARTMENT_OTHER): Payer: Self-pay

## 2023-06-07 MED ORDER — WEGOVY 1 MG/0.5ML ~~LOC~~ SOAJ
1.0000 mg | SUBCUTANEOUS | 0 refills | Status: AC
Start: 2023-06-07 — End: ?
  Filled 2023-06-07: qty 2, 28d supply, fill #0

## 2023-06-13 ENCOUNTER — Other Ambulatory Visit (HOSPITAL_BASED_OUTPATIENT_CLINIC_OR_DEPARTMENT_OTHER): Payer: Self-pay
